# Patient Record
Sex: Male | Born: 1955 | Hispanic: No | Smoking: Former smoker
Health system: Southern US, Community
[De-identification: ages and names within clinical notes are randomized; demographics above are authoritative.]

## PROBLEM LIST (undated history)

## (undated) DIAGNOSIS — M25561 Pain in right knee: Secondary | ICD-10-CM

## (undated) DIAGNOSIS — I10 Essential (primary) hypertension: Secondary | ICD-10-CM

## (undated) DIAGNOSIS — R0683 Snoring: Secondary | ICD-10-CM

## (undated) DIAGNOSIS — N529 Male erectile dysfunction, unspecified: Secondary | ICD-10-CM

## (undated) DIAGNOSIS — F419 Anxiety disorder, unspecified: Secondary | ICD-10-CM

## (undated) DIAGNOSIS — G4700 Insomnia, unspecified: Secondary | ICD-10-CM

## (undated) DIAGNOSIS — M545 Low back pain, unspecified: Secondary | ICD-10-CM

## (undated) DIAGNOSIS — E119 Type 2 diabetes mellitus without complications: Secondary | ICD-10-CM

## (undated) DIAGNOSIS — E785 Hyperlipidemia, unspecified: Secondary | ICD-10-CM

## (undated) DIAGNOSIS — K439 Ventral hernia without obstruction or gangrene: Secondary | ICD-10-CM

## (undated) HISTORY — DX: Hyperlipidemia, unspecified: E78.5

## (undated) HISTORY — DX: Male erectile dysfunction, unspecified: N52.9

## (undated) HISTORY — DX: Type 2 diabetes mellitus without complications: E11.9

## (undated) HISTORY — DX: Pain in right knee: M25.561

## (undated) HISTORY — DX: Snoring: R06.83

## (undated) HISTORY — DX: Anxiety disorder, unspecified: F41.9

## (undated) HISTORY — DX: Low back pain, unspecified: M54.50

## (undated) HISTORY — DX: Ventral hernia without obstruction or gangrene: K43.9

## (undated) HISTORY — DX: Essential (primary) hypertension: I10

## (undated) HISTORY — DX: Insomnia, unspecified: G47.00

---
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
## 2014-09-22 ENCOUNTER — Encounter (HOSPITAL_COMMUNITY): Payer: Self-pay | Admitting: Emergency Medicine

## 2014-09-22 ENCOUNTER — Emergency Department (HOSPITAL_COMMUNITY)
Admission: EM | Admit: 2014-09-22 | Discharge: 2014-09-22 | Disposition: A | Discharge status: Home / Self Care | Payer: PRIVATE HEALTH INSURANCE | Attending: Emergency Medicine | Admitting: Emergency Medicine

## 2014-09-22 ENCOUNTER — Emergency Department (HOSPITAL_COMMUNITY): Payer: PRIVATE HEALTH INSURANCE

## 2014-09-22 DIAGNOSIS — Z87891 Personal history of nicotine dependence: Secondary | ICD-10-CM | POA: Insufficient documentation

## 2014-09-22 DIAGNOSIS — R0789 Other chest pain: Secondary | ICD-10-CM | POA: Diagnosis not present

## 2014-09-22 DIAGNOSIS — R61 Generalized hyperhidrosis: Secondary | ICD-10-CM | POA: Insufficient documentation

## 2014-09-22 DIAGNOSIS — Z79899 Other long term (current) drug therapy: Secondary | ICD-10-CM | POA: Insufficient documentation

## 2014-09-22 DIAGNOSIS — R079 Chest pain, unspecified: Secondary | ICD-10-CM

## 2014-09-22 DIAGNOSIS — I10 Essential (primary) hypertension: Secondary | ICD-10-CM | POA: Diagnosis not present

## 2014-09-22 LAB — BASIC METABOLIC PANEL
Anion gap: 8 (ref 5–15)
BUN: 13 mg/dL (ref 6–23)
CO2: 24 mmol/L (ref 19–32)
CREATININE: 0.71 mg/dL (ref 0.50–1.35)
Calcium: 9.4 mg/dL (ref 8.4–10.5)
Chloride: 105 mmol/L (ref 96–112)
GFR calc Af Amer: >90 mL/min (ref >90)
GFR calc non Af Amer: >90 mL/min (ref >90)
GLUCOSE: 99 mg/dL (ref 70–99)
Potassium: 3.9 mmol/L (ref 3.5–5.1)
Sodium: 137 mmol/L (ref 135–145)

## 2014-09-22 LAB — I-STAT TROPONIN, ED
TROPONIN I, POC: 0 ng/mL (ref 0.00–0.08)
Troponin i, poc: 0 ng/mL (ref 0.00–0.08)

## 2014-09-22 LAB — CBC
HEMATOCRIT: 41.9 % (ref 39.0–52.0)
Hemoglobin: 14.2 g/dL (ref 13.0–17.0)
MCH: 28.9 pg (ref 26.0–34.0)
MCHC: 33.9 g/dL (ref 30.0–36.0)
MCV: 85.2 fL (ref 78.0–100.0)
Platelets: 158 10*3/uL (ref 150–400)
RBC: 4.92 MIL/uL (ref 4.22–5.81)
RDW: 13.4 % (ref 11.5–15.5)
WBC: 5.9 10*3/uL (ref 4.0–10.5)

## 2014-09-22 MED ORDER — TRIAMTERENE-HCTZ 37.5-25 MG PO TABS: 0.5000 | ORAL_TABLET | Freq: Every day | ORAL | Status: DC

## 2014-09-22 NOTE — ED Notes (Signed)
Pt alert, oriented, and ambulatory upon DC.  He was advised to follow up with cardiology.

## 2014-09-22 NOTE — Discharge Instructions (Signed)

## 2014-09-22 NOTE — ED Notes (Signed)
Pt refuses PIV ,sts previous shift has attempted to start one multiple times without success. Pt sts will agree to PIV only if he is 240 % certain he will be admitted to hospital.

## 2014-09-22 NOTE — ED Provider Notes (Addendum)
CSN: [id number]     Arrival date & time 09/22/14  1319 History   First MD Initiated Contact with Patient 09/22/14 1739     Chief Complaint  Patient presents with  . Chest Pain     (Consider location/radiation/quality/duration/timing/severity/associated sxs/prior Treatment) Patient is a 59 y.o. male presenting with chest pain. The history is provided by the patient.  Chest Pain Pain location:  Substernal area Pain quality: tightness   Pain radiates to:  Does not radiate Pain radiates to the back: no   Pain severity:  Mild Onset quality:  Gradual Duration:  30 minutes Timing:  Constant Progression:  Resolved Chronicity:  New Context comment:  Pt states for the last 3 days at 10-10:30 when he is driving to work he becomes dizzy, sweaty and mild chest tightness that usually lasted about 30 min but today lasted longer (2hrs) Relieved by: the first day this happened he thought maybe his sugar was low and he ate when he got to work and felt better.  Not the same the prior days. Worsened by:  Nothing tried Ineffective treatments:  None tried Associated symptoms: diaphoresis   Associated symptoms: no abdominal pain, no anorexia, no back pain, no cough, no fatigue, no fever, no headache, no lower extremity edema, no nausea, no near-syncope, no shortness of breath and not vomiting   Risk factors: male sex   Risk factors: no coronary artery disease, no diabetes mellitus, no hypertension, no immobilization, not obese, no prior DVT/PE, no smoking and no surgery   Risk factors comment:  Patient states he sees his doctor yearly and has never had any problems but today when he went to urgent care his blood pressure was elevated. That is the first time he is known about having high blood pressure   History reviewed. No pertinent past medical history. History reviewed. No pertinent past surgical history. History reviewed. No pertinent family history. History  Substance Use Topics  . Smoking  status: Former Research scientist (life sciences)  . Smokeless tobacco: Not on file  . Alcohol Use: No    Review of Systems  Constitutional: Positive for diaphoresis. Negative for fever and fatigue.  Respiratory: Negative for cough and shortness of breath.   Cardiovascular: Positive for chest pain. Negative for near-syncope.  Gastrointestinal: Negative for nausea, vomiting, abdominal pain and anorexia.  Musculoskeletal: Negative for back pain.  Neurological: Negative for headaches.  All other systems reviewed and are negative.     Allergies  Review of patient's allergies indicates no known allergies.  Home Medications   Prior to Admission medications   Medication Sig Start Date End Date Taking? Authorizing Provider  FLUoxetine (PROZAC) 20 MG tablet Take 20 mg by mouth daily.   Yes Historical Provider, MD  Multiple Vitamin (MULTIVITAMIN WITH MINERALS) TABS tablet Take 1 tablet by mouth daily.   Yes Historical Provider, MD   BP 153/74 mmHg  Pulse 56  Resp 22  SpO2 96% Physical Exam  Constitutional: He is oriented to person, place, and time. He appears well-developed and well-nourished. No distress.  HENT:  Head: Normocephalic and atraumatic.  Mouth/Throat: Oropharynx is clear and moist.  Eyes: Conjunctivae and EOM are normal. Pupils are equal, round, and reactive to light.  Neck: Normal range of motion. Neck supple.  Cardiovascular: Normal rate, regular rhythm and intact distal pulses.   No murmur heard. Pulmonary/Chest: Effort normal and breath sounds normal. No respiratory distress. He has no wheezes. He has no rales.  Abdominal: Soft. He exhibits no distension. There is no  tenderness. There is no rebound and no guarding.  Musculoskeletal: Normal range of motion. He exhibits no edema or tenderness.  Neurological: He is alert and oriented to person, place, and time.  Skin: Skin is warm and dry. No rash noted. No erythema.  Psychiatric: He has a normal mood and affect. His behavior is normal.   Nursing note and vitals reviewed.   ED Course  Procedures (including critical care time) Labs Review Labs Reviewed  CBC  BASIC METABOLIC PANEL  I-STAT [place], ED  [facility], ED    Imaging Review Dg Chest 2 View  09/22/2014   CLINICAL DATA:  Upper left chest tightness since this morning. Hypertension for the past 3 days. Ex-smoker.  EXAM: CHEST  2 VIEW  COMPARISON:  None.  FINDINGS: Normal sized heart. Clear lungs. Minimal diffuse peribronchial thickening. Unremarkable bones.  IMPRESSION: Minimal chronic bronchitic changes.  No acute abnormality.   Electronically Signed   By: Claudie Revering M.D.   On: 09/22/2014 19:44     EKG Interpretation   Date/Time:  Thursday September 22 2014 18:37:47 EDT Ventricular Rate:  64 PR Interval:  180 QRS Duration: 97 QT Interval:  409 QTC Calculation: 422 R Axis:   49 Text Interpretation:  Sinus rhythm RSR' in V1 or V2, right VCD or RVH No  significant change since last tracing Confirmed by [facility]  MD, Loree Fee  [phone]) on 09/22/2014 6:48:42 PM      MDM   Final diagnoses:  Chest pain  Essential hypertension    Patient presenting with atypical chest pain. He states that he has had an episode for the last 3 days between 10:30 and 11:00. Today it last longer total about 1 PM. Patient went to urgent care that time and was found to have hypertension 449 systolic and was asked to come here. Shouldn't sees his doctor yearly and states he has always been healthy. At the most some mild high cholesterol that he did not require medication for.  He is a former smoker quit over a year ago. No family history. He denies any symptoms concerning for PE such as unilateral leg pain or swelling, recent travel and patient is not tachycardic.  He denies any infectious symptoms.  Heart score of 3.  EKG without significant findings. Will repeat to ensure no change. Initial troponin done approximately 1 hour after pain resolved is normal now we are 5 hours  after his pain started and will recheck a troponin. CBC within normal limits. Repeat blood pressure now is 152/74 with a pulse of 56.    If patient's second troponin is normal. Feel that it is important any follow-up with cardiology but don't feel that he necessarily needs to be admitted. We'll discuss with cardiology.  Repeat EKG unchanged.  Repeat trop neg.  spoke with Dr. Harrington Challenger who suggested starting patient on half a tablet of Maxzide and they will call him tomorrow for follow-up appointment  Blanchie Dessert, MD 09/22/14 1949  Blanchie Dessert, MD 09/22/14 6759

## 2014-09-23 ENCOUNTER — Encounter: Payer: Self-pay | Admitting: Internal Medicine

## 2014-09-23 ENCOUNTER — Ambulatory Visit (INDEPENDENT_AMBULATORY_CARE_PROVIDER_SITE_OTHER): Payer: No Typology Code available for payment source | Admitting: Internal Medicine

## 2014-09-23 VITALS — BP 142/80 | HR 72 | Resp 18 | Wt 198.8 lb

## 2014-09-23 DIAGNOSIS — I10 Essential (primary) hypertension: Secondary | ICD-10-CM

## 2014-09-23 NOTE — Progress Notes (Signed)
Cardiology Office Note   Date:  09/23/2014   ID:  John Hughes, DOB 02-13-1956, MRN [id number]  PCP:  Nilda Simmer, MD  Cardiologist:   Dorris Carnes, MD   Patinet is a 59 yo who presents for f/u of chest pain    History of Present Illness: John Hughes is a 59 y.o. male who was seen in the ER yesterday  For  3 days priorto ER visit,  while driving to work  Dizzy, sweaty  Mild tightness.  One splee lasted a couple hours   Otherwise OK  [employer].  No lmitation or symptoms with activity When in ER BP was elevated.  Labs negative  Placed on maxzide 37.5/25.  1/2 per day     Today he feels good  No symptoms   Took maxzide this AM      Current Outpatient Prescriptions  Medication Sig Dispense Refill  . FLUoxetine (PROZAC) 20 MG capsule   5  . Multiple Vitamin (MULTIVITAMIN WITH MINERALS) TABS tablet Take 1 tablet by mouth daily.    Marland Kitchen triamterene-hydrochlorothiazide (MAXZIDE-25) 37.5-25 MG per tablet Take 0.5 tablets by mouth daily. 30 tablet 0   No current facility-administered medications for this visit.    Allergies:   Review of patient's allergies indicates no known allergies.   History reviewed. No pertinent past medical history.  History reviewed. No pertinent past surgical history.   Social History:  The patient  reports that he has quit smoking. He does not have any smokeless tobacco history on file. He reports that he does not drink alcohol or use illicit drugs.   Family History:  The patient's family history includes Diabetes in his father.    ROS:  Please see the history of present illness. All other systems are reviewed and  Negative to the above problem except as noted.    PHYSICAL EXAM: VS:  BP 142/80 mmHg  Pulse 72  Resp 18  Wt 198 lb 12.8 oz (90.175 kg)  SpO2 96%  GEN: Well nourished, well developed, in no acute distress HEENT: normal Neck: no JVD, carotid bruits, or masses Cardiac: RRR; no murmurs, rubs, or gallops,no edema    Respiratory:  clear to auscultation bilaterally, normal work of breathing GI: soft, nontender, nondistended, + BS  No hepatomegaly  MS: no deformity Moving all extremities   Skin: warm and dry, no rash Neuro:  Strength and sensation are intact Psych: euthymic mood, full affect   EKG:  EKG is not ordered today.3/18;  SR 61 bpm     Lipid Panel No results found for: CHOL, TRIG, HDL, CHOLHDL, VLDL, LDLCALC, LDLDIRECT    Wt Readings from Last 3 Encounters:  09/23/14 198 lb 12.8 oz (90.175 kg)      ASSESSMENT AND PLAN: 1.  Dizziness/chest tightness.  I am not convinced related to ischemia  ? To BP  WIll not sched testing unless symtpoms worsen  2.  HTN  BP OK  He will f/u with primary MD in future for BP  Will check BMET in about 10 days     Current medicines are reviewed at length with the patient today.  The patient does not have concerns regarding medicines.  The following changes have been made: None    Labs/ tests ordered today include:  BMET in 10 days No orders of the defined types were placed in this encounter.     Disposition:   FU with me if symptoms worsen  Signed, Dorris Carnes, MD  09/23/2014  [facility] [place], [place], [place]  [address] Phone: [phone]; Fax: [phone]

## 2014-09-23 NOTE — Patient Instructions (Signed)
Your physician recommends that you continue on your current medications as directed. Please refer to the Current Medication list given to you today. Your physician recommends that you return for lab work around 10/05/14. Your physician recommends that you schedule a follow-up appointment end of June/early July with Dr. Harrington Challenger.

## 2014-10-05 ENCOUNTER — Other Ambulatory Visit (INDEPENDENT_AMBULATORY_CARE_PROVIDER_SITE_OTHER): Payer: No Typology Code available for payment source | Admitting: *Deleted

## 2014-10-05 DIAGNOSIS — I10 Essential (primary) hypertension: Secondary | ICD-10-CM

## 2014-10-05 LAB — BASIC METABOLIC PANEL
BUN: 20 mg/dL (ref 6–23)
CALCIUM: 9.9 mg/dL (ref 8.4–10.5)
CO2: 27 mEq/L (ref 19–32)
Chloride: 104 mEq/L (ref 96–112)
Creatinine, Ser: 0.84 mg/dL (ref 0.40–1.50)
GFR: 98.66 mL/min (ref >60.00)
Glucose, Bld: 167 mg/dL — ABNORMAL HIGH (ref 70–99)
Potassium: 4 mEq/L (ref 3.5–5.1)
SODIUM: 136 meq/L (ref 135–145)

## 2014-11-15 ENCOUNTER — Other Ambulatory Visit: Payer: Self-pay | Admitting: *Deleted

## 2014-11-15 MED ORDER — TRIAMTERENE-HCTZ 37.5-25 MG PO TABS: 0.5000 | ORAL_TABLET | Freq: Every day | ORAL | Status: AC

## 2014-12-29 ENCOUNTER — Ambulatory Visit: Payer: No Typology Code available for payment source | Admitting: Internal Medicine

## 2016-12-11 IMAGING — CR DG CHEST 2V
2 series · 2 of 2 positions shown · non-contrast
Comparison: None.

CLINICAL DATA: Upper left chest tightness since this morning.
Hypertension for the past 3 days. Ex-smoker.

EXAM:
CHEST  2 VIEW

[w chest pa]
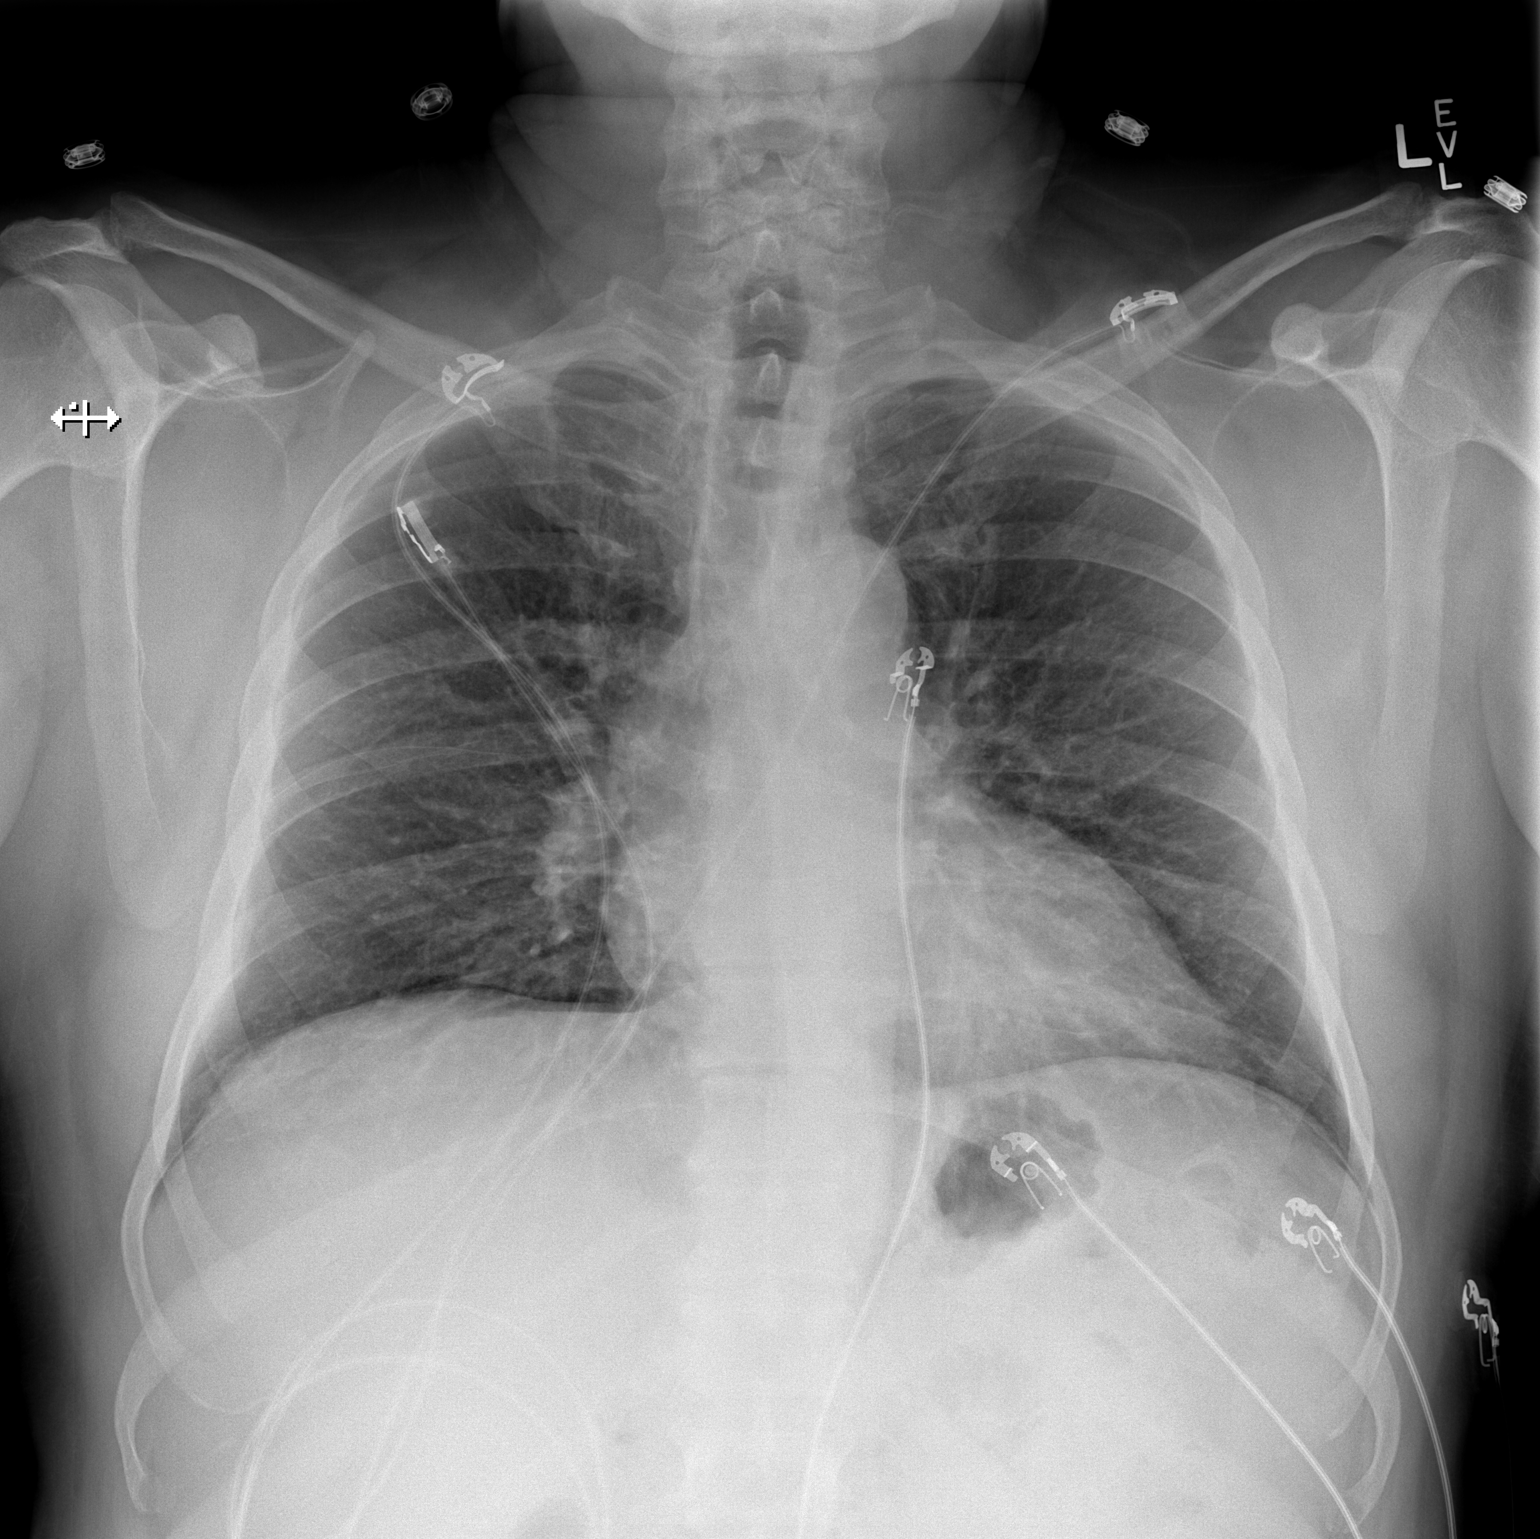

[w chest lat]
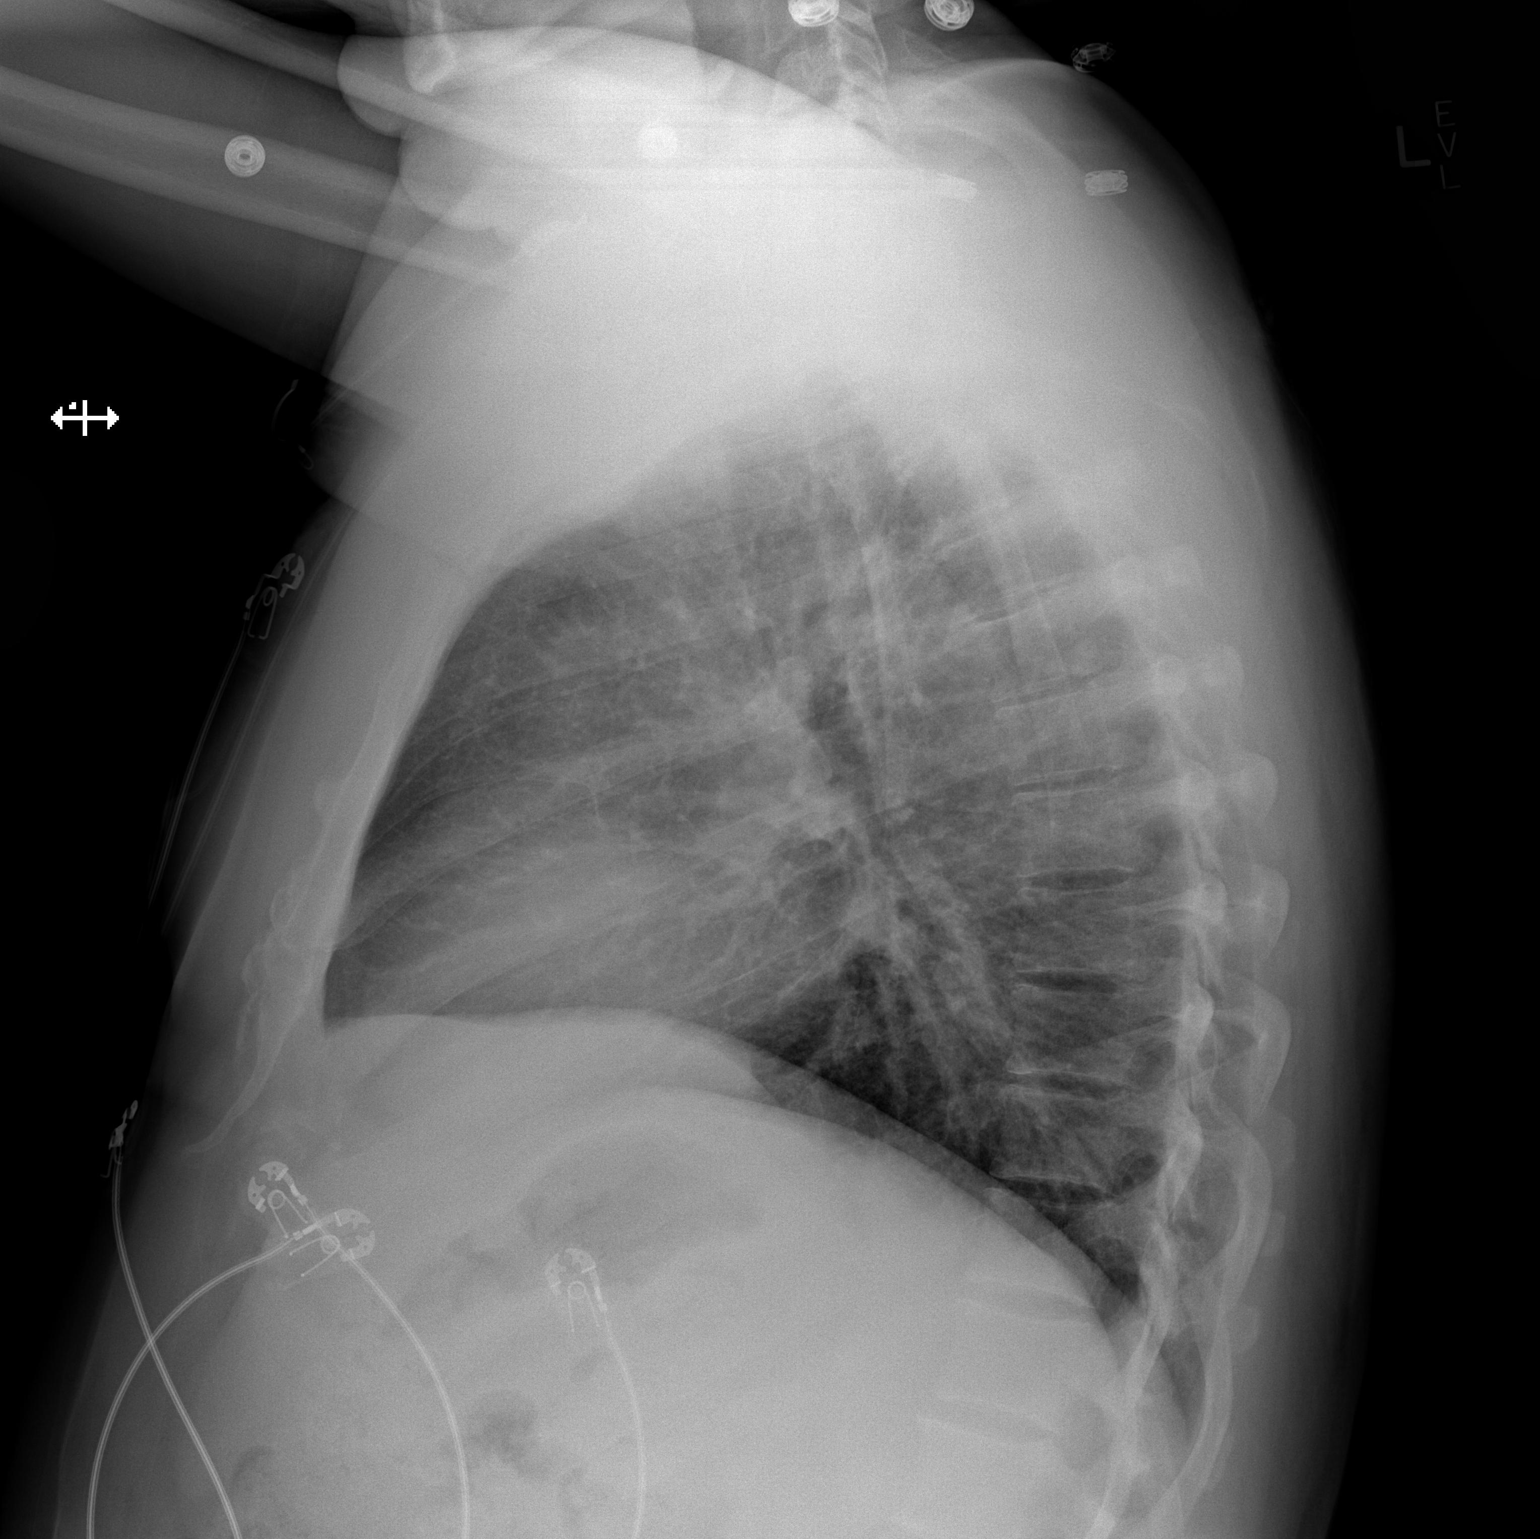

[2 of 2 positions shown; findings below may reference images not displayed]

FINDINGS: Normal sized heart. Clear lungs. Minimal diffuse peribronchial
thickening. Unremarkable bones.
IMPRESSION: Minimal chronic bronchitic changes.  No acute abnormality.

## 2018-07-27 DIAGNOSIS — D229 Melanocytic nevi, unspecified: Secondary | ICD-10-CM | POA: Diagnosis not present

## 2018-07-27 DIAGNOSIS — B360 Pityriasis versicolor: Secondary | ICD-10-CM | POA: Diagnosis not present

## 2018-07-27 DIAGNOSIS — L821 Other seborrheic keratosis: Secondary | ICD-10-CM | POA: Diagnosis not present

## 2018-07-29 DIAGNOSIS — R69 Illness, unspecified: Secondary | ICD-10-CM | POA: Diagnosis not present

## 2018-10-13 DIAGNOSIS — D171 Benign lipomatous neoplasm of skin and subcutaneous tissue of trunk: Secondary | ICD-10-CM | POA: Diagnosis not present

## 2018-10-13 DIAGNOSIS — K439 Ventral hernia without obstruction or gangrene: Secondary | ICD-10-CM | POA: Diagnosis not present

## 2018-10-13 DIAGNOSIS — M25561 Pain in right knee: Secondary | ICD-10-CM | POA: Diagnosis not present

## 2018-12-07 DIAGNOSIS — S83241A Other tear of medial meniscus, current injury, right knee, initial encounter: Secondary | ICD-10-CM | POA: Diagnosis not present

## 2018-12-15 DIAGNOSIS — M25561 Pain in right knee: Secondary | ICD-10-CM | POA: Diagnosis not present

## 2018-12-17 DIAGNOSIS — S83241D Other tear of medial meniscus, current injury, right knee, subsequent encounter: Secondary | ICD-10-CM | POA: Diagnosis not present

## 2018-12-18 DIAGNOSIS — E785 Hyperlipidemia, unspecified: Secondary | ICD-10-CM | POA: Diagnosis not present

## 2018-12-18 DIAGNOSIS — I10 Essential (primary) hypertension: Secondary | ICD-10-CM | POA: Diagnosis not present

## 2018-12-18 DIAGNOSIS — Z833 Family history of diabetes mellitus: Secondary | ICD-10-CM | POA: Diagnosis not present

## 2018-12-18 DIAGNOSIS — Z87891 Personal history of nicotine dependence: Secondary | ICD-10-CM | POA: Diagnosis not present

## 2018-12-18 DIAGNOSIS — E119 Type 2 diabetes mellitus without complications: Secondary | ICD-10-CM | POA: Diagnosis not present

## 2018-12-18 DIAGNOSIS — Z7984 Long term (current) use of oral hypoglycemic drugs: Secondary | ICD-10-CM | POA: Diagnosis not present

## 2019-01-14 DIAGNOSIS — E119 Type 2 diabetes mellitus without complications: Secondary | ICD-10-CM | POA: Diagnosis not present

## 2019-01-14 DIAGNOSIS — R9431 Abnormal electrocardiogram [ECG] [EKG]: Secondary | ICD-10-CM | POA: Diagnosis not present

## 2019-01-14 DIAGNOSIS — N529 Male erectile dysfunction, unspecified: Secondary | ICD-10-CM | POA: Diagnosis not present

## 2019-01-14 DIAGNOSIS — Z87891 Personal history of nicotine dependence: Secondary | ICD-10-CM | POA: Diagnosis not present

## 2019-01-14 DIAGNOSIS — I10 Essential (primary) hypertension: Secondary | ICD-10-CM | POA: Diagnosis not present

## 2019-01-14 DIAGNOSIS — Z01818 Encounter for other preprocedural examination: Secondary | ICD-10-CM | POA: Diagnosis not present

## 2019-01-14 DIAGNOSIS — E782 Mixed hyperlipidemia: Secondary | ICD-10-CM | POA: Diagnosis not present

## 2019-02-02 DIAGNOSIS — H6123 Impacted cerumen, bilateral: Secondary | ICD-10-CM | POA: Diagnosis not present

## 2019-02-02 DIAGNOSIS — J310 Chronic rhinitis: Secondary | ICD-10-CM | POA: Diagnosis not present

## 2019-02-02 DIAGNOSIS — H6523 Chronic serous otitis media, bilateral: Secondary | ICD-10-CM | POA: Diagnosis not present

## 2019-02-02 DIAGNOSIS — H6993 Unspecified Eustachian tube disorder, bilateral: Secondary | ICD-10-CM | POA: Diagnosis not present

## 2019-02-12 DIAGNOSIS — E119 Type 2 diabetes mellitus without complications: Secondary | ICD-10-CM | POA: Diagnosis not present

## 2019-02-16 DIAGNOSIS — H524 Presbyopia: Secondary | ICD-10-CM | POA: Diagnosis not present

## 2019-03-08 DIAGNOSIS — I10 Essential (primary) hypertension: Secondary | ICD-10-CM | POA: Insufficient documentation

## 2019-03-08 NOTE — Progress Notes (Signed)
Cardiology Office Note    Date:  03/09/2019   ID:  John Hughes, DOB [birth year], MRN [id number]  PCP:  Delilah Shan, MD  Cardiologist:  Fransico Him, MD   Chief Complaint  Patient presents with  . New Patient (Initial Visit)    abnormal EKG    History of Present Illness:  John Hughes is a 63 y.o. male who is being seen today for the evaluation of preoperative cardiac clearance at the request of Delilah Shan, MD.  John Hughes is a 63 y.o. male with a hx of DM, HLD and prior CP evaluated by Dr. Harrington Challenger in 2016 and felt to be atypical with no further workup.  He is now here for preop cardiac clearance for right knee arthoscopy.  Apparently had an abnormal EKG at OV with PCP with inferior Q waves in III and aVF and is now here for further evaluation. He denies any chest pain or pressure, SOB, DOE, PND, orthopnea, LE edema, dizziness, palpitations or syncope. He is compliant with his meds and is tolerating meds with no SE.    Past Medical History:  Diagnosis Date  . Anxiety   . Benign essential HTN   . DM (diabetes mellitus) ([place])   . ED (erectile dysfunction)   . Hyperlipidemia   . Insomnia   . Knee pain, right   . Low back pain without sciatica   . Snoring   . Ventral hernia without obstruction or gangrene     History reviewed. No pertinent surgical history.  Current Medications: Current Meds  Medication Sig  . aspirin EC 81 MG tablet Take 81 mg by mouth daily.  Marland Kitchen atorvastatin (LIPITOR) 40 MG tablet Take 40 mg by mouth daily.  Marland Kitchen FLUoxetine (PROZAC) 20 MG capsule Take 1 capsule by mouth daily.  . metFORMIN (GLUCOPHAGE) 500 MG tablet Take 500 mg by mouth. Take 2 tablets (1000 mg ) by mouth daily twice a day a total of (2000 mg daily)  . Multiple Vitamins-Minerals (MULTIVITAMIN WITH MINERALS) tablet Take 1 tablet by mouth daily.  . sildenafil (REVATIO) 20 MG tablet 20 mg. Take 1-5 tablets daily as needed  . triamterene-hydrochlorothiazide (MAXZIDE-25) 37.5-25 MG  per tablet Take 0.5 tablets by mouth daily.    Allergies:   Patient has no known allergies.   Social History   Socioeconomic History  . Marital status: Widowed    Spouse name: Not on file  . Number of children: Not on file  . Years of education: Not on file  . Highest education level: Not on file  Occupational History  . Not on file  Social Needs  . Financial resource strain: Not on file  . Food insecurity    Worry: Not on file    Inability: Not on file  . Transportation needs    Medical: Not on file    Non-medical: Not on file  Tobacco Use  . Smoking status: Former Research scientist (life sciences)  . Smokeless tobacco: Never Used  Substance and Sexual Activity  . Alcohol use: Not Currently  . Drug use: No  . Sexual activity: Not on file  Lifestyle  . Physical activity    Days per week: Not on file    Minutes per session: Not on file  . Stress: Not on file  Relationships  . Social Herbalist on phone: Not on file    Gets together: Not on file    Attends religious service: Not on file    Active member  of club or organization: Not on file    Attends meetings of clubs or organizations: Not on file    Relationship status: Not on file  Other Topics Concern  . Not on file  Social History Narrative  . Not on file     Family History:  The patient's family history includes Diabetes in his father.   ROS:   Please see the history of present illness.    ROS All other systems reviewed and are negative.  No flowsheet data found.     PHYSICAL EXAM:   VS:  BP 126/80   Pulse (!) 55   Ht 5\' 5"  (1.651 m)   Wt 191 lb 3.2 oz (86.7 kg)   SpO2 94%   BMI 31.82 kg/m    GEN: Well nourished, well developed, in no acute distress  HEENT: normal  Neck: no JVD, carotid bruits, or masses Cardiac: RRR; no murmurs, rubs, or gallops,no edema.  Intact distal pulses bilaterally.  Respiratory:  clear to auscultation bilaterally, normal work of breathing GI: soft, nontender, nondistended, + BS MS:  no deformity or atrophy  Skin: warm and dry, no rash Neuro:  Alert and Oriented x 3, Strength and sensation are intact Psych: euthymic mood, full affect  Wt Readings from Last 3 Encounters:  03/09/19 191 lb 3.2 oz (86.7 kg)  09/23/14 198 lb 12.8 oz (90.2 kg)      Studies/Labs Reviewed:   EKG:  EKG is ordered today.  The ekg ordered today demonstrates NSR with Q wave in III and no ST changes  Recent Labs: No results found for requested labs within last 8760 hours.   Lipid Panel No results found for: CHOL, TRIG, HDL, CHOLHDL, VLDL, LDLCALC, LDLDIRECT  Additional studies/ records that were reviewed today include:  Office notes from PCP    ASSESSMENT:    1. Preoperative clearance   2. Right knee pain, unspecified chronicity   3. Benign essential HTN   4. Abnormal EKG      PLAN:  In order of problems listed above:  1.   Preoperative cardiac clearance - he has no anginal sx and has been able to exercise to at least 4.5 mets so no further ischemic workup indicated at this time if 2D echo shows normal wall motion.  His revised cardiac risk index is low at 0.4% risk of major cardiac event.    2.  Right knee pain - plan is for right knee arthroscopy.  3.  Hypertension - BP is controlled.  Continue on Maxide.    4.  Abnormal EKG - EKG at PCP office showed Q waves in III and aVL.  EKG today showed Q wave only in III.  He is completely asymptomatic.  I will get a 2D echo to rule out inferior wall motion abnormality.    Medication Adjustments/Labs and Tests Ordered: Current medicines are reviewed at length with the patient today.  Concerns regarding medicines are outlined above.  Medication changes, Labs and Tests ordered today are listed in the Patient Instructions below.  There are no Patient Instructions on file for this visit.   Signed, Fransico Him, MD  03/09/2019 10:24 AM    [facility] [place], [place], [place]  [address] Phone: [phone]; Fax: [phone]

## 2019-03-09 ENCOUNTER — Encounter: Payer: Self-pay | Admitting: Cardiology

## 2019-03-09 ENCOUNTER — Ambulatory Visit (INDEPENDENT_AMBULATORY_CARE_PROVIDER_SITE_OTHER): Payer: Medicare HMO | Admitting: Cardiology

## 2019-03-09 ENCOUNTER — Other Ambulatory Visit: Payer: Self-pay

## 2019-03-09 VITALS — BP 126/80 | HR 55 | Ht 65.0 in | Wt 191.2 lb

## 2019-03-09 DIAGNOSIS — R9431 Abnormal electrocardiogram [ECG] [EKG]: Secondary | ICD-10-CM

## 2019-03-09 DIAGNOSIS — Z01810 Encounter for preprocedural cardiovascular examination: Secondary | ICD-10-CM

## 2019-03-09 DIAGNOSIS — Z01818 Encounter for other preprocedural examination: Secondary | ICD-10-CM | POA: Diagnosis not present

## 2019-03-09 DIAGNOSIS — I10 Essential (primary) hypertension: Secondary | ICD-10-CM | POA: Diagnosis not present

## 2019-03-09 DIAGNOSIS — M25561 Pain in right knee: Secondary | ICD-10-CM | POA: Diagnosis not present

## 2019-03-09 NOTE — Patient Instructions (Signed)
Medication Instructions:   Your physician recommends that you continue on your current medications as directed. Please refer to the Current Medication list given to you today.  If you need a refill on your cardiac medications before your next appointment, please call your pharmacy.    Testing/Procedures:  Your physician has requested that you have an echocardiogram. Echocardiography is a painless test that uses sound waves to create images of your heart. It provides your doctor with information about the size and shape of your heart and how well your heart's chambers and valves are working. This procedure takes approximately one hour. There are no restrictions for this procedure.    Follow-Up:  WITH DR TURNER AS NEEDED

## 2019-03-17 ENCOUNTER — Other Ambulatory Visit: Payer: Self-pay

## 2019-03-17 ENCOUNTER — Telehealth: Payer: Self-pay

## 2019-03-17 ENCOUNTER — Ambulatory Visit (HOSPITAL_COMMUNITY): Payer: Medicare HMO | Attending: Cardiology

## 2019-03-17 DIAGNOSIS — R9431 Abnormal electrocardiogram [ECG] [EKG]: Secondary | ICD-10-CM | POA: Insufficient documentation

## 2019-03-17 NOTE — Telephone Encounter (Signed)
Echo results released to mychart, pt message sent

## 2019-03-17 NOTE — Telephone Encounter (Deleted)
Dr. Radford Pax found your echocardiogram results to be normal. Please call with any further questions [phone]

## 2019-03-18 DIAGNOSIS — R69 Illness, unspecified: Secondary | ICD-10-CM | POA: Diagnosis not present

## 2019-03-18 DIAGNOSIS — E119 Type 2 diabetes mellitus without complications: Secondary | ICD-10-CM | POA: Diagnosis not present

## 2019-03-18 DIAGNOSIS — Z7984 Long term (current) use of oral hypoglycemic drugs: Secondary | ICD-10-CM | POA: Diagnosis not present

## 2019-08-10 DIAGNOSIS — R69 Illness, unspecified: Secondary | ICD-10-CM | POA: Diagnosis not present

## 2019-08-19 ENCOUNTER — Ambulatory Visit: Payer: Medicare HMO | Attending: Internal Medicine

## 2019-08-19 DIAGNOSIS — Z23 Encounter for immunization: Secondary | ICD-10-CM | POA: Insufficient documentation

## 2019-08-19 NOTE — Progress Notes (Signed)
   Covid-19 Vaccination Clinic  Name:  John Hughes    MRN: [id number] DOB: [birth year]  08/19/2019  [name] was observed post Covid-19 immunization for 15 minutes without incidence. He was provided with Vaccine Information Sheet and instruction to access the V-Safe system.   [name] was instructed to call 911 with any severe reactions post vaccine: Marland Kitchen Difficulty breathing  . Swelling of your face and throat  . A fast heartbeat  . A bad rash all over your body  . Dizziness and weakness    Immunizations Administered    Name Date Dose VIS Date Route   Pfizer COVID-19 Vaccine 08/19/2019  9:04 AM 0.3 mL 06/18/2019 Intramuscular   Manufacturer: [place]   Lot: [id number]   [place]: [id number]    \

## 2019-09-13 ENCOUNTER — Ambulatory Visit: Payer: Medicare HMO | Attending: Internal Medicine

## 2019-09-13 DIAGNOSIS — Z23 Encounter for immunization: Secondary | ICD-10-CM

## 2019-09-13 NOTE — Progress Notes (Signed)
   Covid-19 Vaccination Clinic  Name:  John Hughes    MRN: [id number] DOB: December 28, 1955  09/13/2019  Mr. John Hughes was observed post Covid-19 immunization for 15 minutes without incident. He was provided with Vaccine Information Sheet and instruction to access the V-Safe system.   Mr. John Hughes was instructed to call 911 with any severe reactions post vaccine: Marland Kitchen Difficulty breathing  . Swelling of face and throat  . A fast heartbeat  . A bad rash all over body  . Dizziness and weakness   Immunizations Administered    Name Date Dose VIS Date Route   Pfizer COVID-19 Vaccine 09/13/2019  1:59 PM 0.3 mL 06/18/2019 Intramuscular   Manufacturer: [place]   Lot: [id number]   [place]: [id number]

## 2019-10-06 DIAGNOSIS — R69 Illness, unspecified: Secondary | ICD-10-CM | POA: Diagnosis not present

## 2019-11-18 DIAGNOSIS — Z008 Encounter for other general examination: Secondary | ICD-10-CM | POA: Diagnosis not present

## 2019-11-18 DIAGNOSIS — E119 Type 2 diabetes mellitus without complications: Secondary | ICD-10-CM | POA: Diagnosis not present

## 2019-11-18 DIAGNOSIS — Z604 Social exclusion and rejection: Secondary | ICD-10-CM | POA: Diagnosis not present

## 2019-11-18 DIAGNOSIS — G4700 Insomnia, unspecified: Secondary | ICD-10-CM | POA: Diagnosis not present

## 2019-11-18 DIAGNOSIS — E785 Hyperlipidemia, unspecified: Secondary | ICD-10-CM | POA: Diagnosis not present

## 2019-11-18 DIAGNOSIS — R69 Illness, unspecified: Secondary | ICD-10-CM | POA: Diagnosis not present

## 2019-11-18 DIAGNOSIS — Z809 Family history of malignant neoplasm, unspecified: Secondary | ICD-10-CM | POA: Diagnosis not present

## 2019-11-18 DIAGNOSIS — Z8249 Family history of ischemic heart disease and other diseases of the circulatory system: Secondary | ICD-10-CM | POA: Diagnosis not present

## 2019-11-18 DIAGNOSIS — Z7984 Long term (current) use of oral hypoglycemic drugs: Secondary | ICD-10-CM | POA: Diagnosis not present

## 2019-11-18 DIAGNOSIS — E669 Obesity, unspecified: Secondary | ICD-10-CM | POA: Diagnosis not present

## 2019-11-18 DIAGNOSIS — I10 Essential (primary) hypertension: Secondary | ICD-10-CM | POA: Diagnosis not present

## 2019-11-24 DIAGNOSIS — R69 Illness, unspecified: Secondary | ICD-10-CM | POA: Diagnosis not present

## 2020-03-31 ENCOUNTER — Ambulatory Visit (INDEPENDENT_AMBULATORY_CARE_PROVIDER_SITE_OTHER): Payer: Medicare HMO | Admitting: Otolaryngology

## 2020-03-31 ENCOUNTER — Other Ambulatory Visit: Payer: Self-pay

## 2020-03-31 VITALS — Temp 97.5°F

## 2020-03-31 DIAGNOSIS — H6522 Chronic serous otitis media, left ear: Secondary | ICD-10-CM

## 2020-03-31 NOTE — Progress Notes (Signed)
HPI: John Hughes is a 64 y.o. male who returns today for evaluation of of wax in his ears.  He complains mostly of decreased hearing in the left ear for the past month or 2.  He has had no pain in the ear.  He has had previous tubes in the left ear last time was a T-tube that was placed in 2016 has subsequently extruded..  Past Medical History:  Diagnosis Date  . Anxiety   . Benign essential HTN   . DM (diabetes mellitus) ([place])   . ED (erectile dysfunction)   . Hyperlipidemia   . Insomnia   . Knee pain, right   . Low back pain without sciatica   . Snoring   . Ventral hernia without obstruction or gangrene    No past surgical history on file. Social History   Socioeconomic History  . Marital status: Widowed    Spouse name: Not on file  . Number of children: Not on file  . Years of education: Not on file  . Highest education level: Not on file  Occupational History  . Not on file  Tobacco Use  . Smoking status: Former Research scientist (life sciences)  . Smokeless tobacco: Never Used  Substance and Sexual Activity  . Alcohol use: Not Currently  . Drug use: No  . Sexual activity: Not on file  Other Topics Concern  . Not on file  Social History Narrative  . Not on file   Social Determinants of Health   Financial Resource Strain:   . Difficulty of Paying Living Expenses: Not on file  Food Insecurity:   . Worried About Charity fundraiser in the Last Year: Not on file  . Ran Out of Food in the Last Year: Not on file  Transportation Needs:   . Lack of Transportation (Medical): Not on file  . Lack of Transportation (Non-Medical): Not on file  Physical Activity:   . Days of Exercise per Week: Not on file  . Minutes of Exercise per Session: Not on file  Stress:   . Feeling of Stress : Not on file  Social Connections:   . Frequency of Communication with Friends and Family: Not on file  . Frequency of Social Gatherings with Friends and Family: Not on file  . Attends Religious Services: Not on  file  . Active Member of Clubs or Organizations: Not on file  . Attends Archivist Meetings: Not on file  . Marital Status: Not on file   Family History  Problem Relation Age of Onset  . Diabetes Father    No Known Allergies Prior to Admission medications   Medication Sig Start Date End Date Taking? Authorizing Provider  aspirin EC 81 MG tablet Take 81 mg by mouth daily.   Yes [provider]  atorvastatin (LIPITOR) 40 MG tablet Take 40 mg by mouth daily.   Yes [provider]  FLUoxetine (PROZAC) 20 MG capsule Take 1 capsule by mouth daily. 12/24/16  Yes [provider]  metFORMIN (GLUCOPHAGE) 500 MG tablet Take 500 mg by mouth. Take 2 tablets (1000 mg ) by mouth daily twice a day a total of (2000 mg daily)   Yes [provider]  Multiple Vitamins-Minerals (MULTIVITAMIN WITH MINERALS) tablet Take 1 tablet by mouth daily.   Yes [provider]  sildenafil (REVATIO) 20 MG tablet 20 mg. Take 1-5 tablets daily as needed   Yes [provider]  triamterene-hydrochlorothiazide (MAXZIDE-25) 37.5-25 MG per tablet Take 0.5 tablets by  mouth daily. 11/15/14  Yes Fay Records, MD     Positive ROS: Otherwise negative  All other systems have been reviewed and were otherwise negative with the exception of those mentioned in the HPI and as above.  Physical Exam: Constitutional: Alert, well-appearing, no acute distress Ears: External ears without lesions or tenderness. Ear canals.  He had minimal wax buildup on the right side that was removed with a curette and the right TM was clear.  The left TM was retracted with a serous otitis media.  A M&T was performed on the left side using phenol and a modified T-tube was inserted.  A serous effusion was aspirated with improved hearing. Nasal: External nose without lesions. Septum mild deformity and mild rhinitis.. Clear nasal passages otherwise.  Posterior nasal cavity was clear. Oral: Lips and  gums without lesions. Tongue and palate mucosa without lesions. Posterior oropharynx clear.  On mirror examination of nasopharynx this was clear. Neck: No palpable adenopathy or masses Respiratory: Breathing comfortably  Skin: No facial/neck lesions or rash noted.  Myringotomy  Date/Time: 03/31/2020 4:55 PM Performed by: Rozetta Nunnery, MD Authorized by: Rozetta Nunnery, MD   Consent:    Consent obtained:  Verbal   Consent given by:  Patient   Risks discussed:  Bleeding and pain   Alternatives discussed:  No treatment Pre-procedure details:    Indications: serous otitis media   Anesthesia:    Anesthesia method:  Topical application   Topical anesthetic:  Phenol Procedure Details:    Location:  Left TM   Hole made in:  Anterosuperior aspect of TM   Tube:  T-tube Findings:    Fluid:  Serous fluid Post-procedure details:    Patient tolerance of procedure:  Tolerated well, no immediate complications Comments:     Patient with left serous otitis media and conductive hearing loss.  Has had previous T-tube in the left ear. Myringotomy was performed anteriorly and a modified T-tube was inserted without difficulty.    Assessment: Left serous otitis media with conductive hearing loss  Plan: Left M&T with a modified T-tube was performed in the office today. He will follow-up in 2 weeks for recheck   Radene Journey, MD

## 2020-05-02 ENCOUNTER — Ambulatory Visit (INDEPENDENT_AMBULATORY_CARE_PROVIDER_SITE_OTHER): Payer: Medicare HMO | Admitting: Otolaryngology

## 2020-05-02 ENCOUNTER — Other Ambulatory Visit: Payer: Self-pay

## 2020-05-02 VITALS — BP 170/88 | HR 69 | Temp 97.2°F

## 2020-05-02 DIAGNOSIS — Z4889 Encounter for other specified surgical aftercare: Secondary | ICD-10-CM

## 2020-05-02 NOTE — Progress Notes (Signed)
HPI: John Hughes is a 64 y.o. male who presents 4 weeks  s/p placement of the left T-tube.  He is doing well with no drainage from the ear..   Past Medical History:  Diagnosis Date  . Anxiety   . Benign essential HTN   . DM (diabetes mellitus) ([place])   . ED (erectile dysfunction)   . Hyperlipidemia   . Insomnia   . Knee pain, right   . Low back pain without sciatica   . Snoring   . Ventral hernia without obstruction or gangrene    No past surgical history on file. Social History   Socioeconomic History  . Marital status: Widowed    Spouse name: Not on file  . Number of children: Not on file  . Years of education: Not on file  . Highest education level: Not on file  Occupational History  . Not on file  Tobacco Use  . Smoking status: Former Research scientist (life sciences)  . Smokeless tobacco: Never Used  Substance and Sexual Activity  . Alcohol use: Not Currently  . Drug use: No  . Sexual activity: Not on file  Other Topics Concern  . Not on file  Social History Narrative  . Not on file   Social Determinants of Health   Financial Resource Strain:   . Difficulty of Paying Living Expenses: Not on file  Food Insecurity:   . Worried About Charity fundraiser in the Last Year: Not on file  . Ran Out of Food in the Last Year: Not on file  Transportation Needs:   . Lack of Transportation (Medical): Not on file  . Lack of Transportation (Non-Medical): Not on file  Physical Activity:   . Days of Exercise per Week: Not on file  . Minutes of Exercise per Session: Not on file  Stress:   . Feeling of Stress : Not on file  Social Connections:   . Frequency of Communication with Friends and Family: Not on file  . Frequency of Social Gatherings with Friends and Family: Not on file  . Attends Religious Services: Not on file  . Active Member of Clubs or Organizations: Not on file  . Attends Archivist Meetings: Not on file  . Marital Status: Not on file   Family History  Problem  Relation Age of Onset  . Diabetes Father    No Known Allergies Prior to Admission medications   Medication Sig Start Date End Date Taking? Authorizing Provider  aspirin EC 81 MG tablet Take 81 mg by mouth daily.    [provider]  atorvastatin (LIPITOR) 40 MG tablet Take 40 mg by mouth daily.    [provider]  FLUoxetine (PROZAC) 20 MG capsule Take 1 capsule by mouth daily. 12/24/16   [provider]  metFORMIN (GLUCOPHAGE) 500 MG tablet Take 500 mg by mouth. Take 2 tablets (1000 mg ) by mouth daily twice a day a total of (2000 mg daily)    [provider]  Multiple Vitamins-Minerals (MULTIVITAMIN WITH MINERALS) tablet Take 1 tablet by mouth daily.    [provider]  sildenafil (REVATIO) 20 MG tablet 20 mg. Take 1-5 tablets daily as needed    [provider]  triamterene-hydrochlorothiazide (MAXZIDE-25) 37.5-25 MG per tablet Take 0.5 tablets by mouth daily. 11/15/14   Fay Records, MD     Physical Exam: T-tube is in good position and is clear with a clear TM.   Assessment: S/p placement of left T-tube last month.  Doing well.  Plan: He will follow-up as needed.   Radene Journey, MD
